# Patient Record
Sex: Male | Born: 2000 | Race: Black or African American | Hispanic: No | Marital: Single | Smoking: Current every day smoker
Health system: Southern US, Community
[De-identification: ages and names within clinical notes are randomized; demographics above are authoritative.]

## PROBLEM LIST (undated history)

## (undated) DIAGNOSIS — J45909 Unspecified asthma, uncomplicated: Secondary | ICD-10-CM

## (undated) DIAGNOSIS — L309 Dermatitis, unspecified: Secondary | ICD-10-CM

---
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
## 2019-05-01 ENCOUNTER — Emergency Department (HOSPITAL_COMMUNITY)
Admission: EM | Admit: 2019-05-01 | Discharge: 2019-05-01 | Disposition: A | Discharge status: Home / Self Care | Payer: Medicaid Other | Attending: Emergency Medicine | Admitting: Emergency Medicine

## 2019-05-01 ENCOUNTER — Encounter (HOSPITAL_COMMUNITY): Payer: Self-pay | Admitting: Emergency Medicine

## 2019-05-01 ENCOUNTER — Emergency Department (HOSPITAL_COMMUNITY): Payer: Medicaid Other

## 2019-05-01 DIAGNOSIS — Z113 Encounter for screening for infections with a predominantly sexual mode of transmission: Secondary | ICD-10-CM | POA: Insufficient documentation

## 2019-05-01 DIAGNOSIS — J45909 Unspecified asthma, uncomplicated: Secondary | ICD-10-CM | POA: Diagnosis not present

## 2019-05-01 DIAGNOSIS — N50812 Left testicular pain: Secondary | ICD-10-CM

## 2019-05-01 DIAGNOSIS — N50811 Right testicular pain: Secondary | ICD-10-CM | POA: Insufficient documentation

## 2019-05-01 DIAGNOSIS — F17200 Nicotine dependence, unspecified, uncomplicated: Secondary | ICD-10-CM | POA: Insufficient documentation

## 2019-05-01 LAB — URINALYSIS, ROUTINE W REFLEX MICROSCOPIC
Bilirubin Urine: NEGATIVE
Glucose, UA: NEGATIVE mg/dL
Hgb urine dipstick: NEGATIVE
Ketones, ur: NEGATIVE mg/dL
Leukocytes,Ua: NEGATIVE
Nitrite: NEGATIVE
Protein, ur: NEGATIVE mg/dL
Specific Gravity, Urine: 1.018 (ref 1.005–1.030)
pH: 8 (ref 5.0–8.0)

## 2019-05-01 MED ORDER — DOXYCYCLINE HYCLATE 100 MG PO CAPS: 100.0000 mg | ORAL_CAPSULE | Freq: Two times a day (BID) | ORAL | 0 refills | Status: AC

## 2019-05-01 MED ORDER — LIDOCAINE HCL (PF) 1 % IJ SOLN
INTRAMUSCULAR | Status: AC
  Filled 2019-05-01: qty 5

## 2019-05-01 MED ORDER — CEFTRIAXONE SODIUM 500 MG IJ SOLR
500.0000 mg | Freq: Once | INTRAMUSCULAR | Status: AC
  Administered 2019-05-01: 19:00:00 500 mg via INTRAMUSCULAR
  Filled 2019-05-01: qty 500

## 2019-05-01 MED ORDER — DOXYCYCLINE HYCLATE 100 MG PO TABS
100.0000 mg | ORAL_TABLET | Freq: Once | ORAL | Status: AC
  Administered 2019-05-01: 19:00:00 100 mg via ORAL
  Filled 2019-05-01: qty 1

## 2019-05-01 NOTE — ED Notes (Signed)
Pt. Stated he feels nauseous. Pt. Reassessed and repositioned. MD notified.

## 2019-05-01 NOTE — Discharge Instructions (Addendum)
As discussed, your ultrasound was negative for any acute abnormalities.  Your symptoms could be related to STD infection.  You have been treated for STDs here in the ED.  I am sending you home with a prescription for an antibiotic.  Take twice a day for 7 days.  If your gonorrhea and Chlamydia test are negative, please call urology to schedule appointment for further evaluation.  Your urine was negative for any infections.  Follow-up with PCP if symptoms do not improve within the next week.  Return to the ER for new or worsening symptoms.

## 2019-05-01 NOTE — ED Provider Notes (Signed)
[facility] EMERGENCY DEPARTMENT Provider Note   CSN: [id number] Arrival date & time: 05/01/19  1634     History Chief Complaint  Patient presents with  . Testicle Pain  . Dysuria    Wayne Roach is a 19 y.o. male with a past medical history significant for asthma and eczema who presents to the ED due to consistent bilateral testicular pain and dysuria for the past month.  Patient states he was last sexually active back in February.  He tested negative for STDs in February; however, tested positive in March for Chlamydia.  Patient admits to intermittent bilateral testicular pain with and without urination.  Denies painful defecations.  Denies penile discharge, testicular swelling, penile swelling, abdominal pain, nausea, vomiting, diarrhea, fever, chills. No alleviating factors.   History obtained from patient and past medical records. No interpreter used during encounter.    Past Medical History:  Diagnosis Date  . Asthma   . Eczema     There are no problems to display for this patient.   History reviewed. No pertinent surgical history.     Family History  Family history unknown: Yes    Social History   Tobacco Use  . Smoking status: Current Every Day Smoker  . Smokeless tobacco: Never Used  Substance Use Topics  . Alcohol use: Yes  . Drug use: Yes    Types: Marijuana    Home Medications Prior to Admission medications   Medication Sig Start Date End Date Taking? Authorizing Provider  doxycycline (VIBRAMYCIN) 100 MG capsule Take 1 capsule (100 mg total) by mouth 2 (two) times daily for 7 days. 05/01/19 05/08/19  Suzy Bouchard, PA-C    Allergies    Patient has no known allergies.  Review of Systems   Review of Systems  Constitutional: Negative for fever.  Gastrointestinal: Negative for abdominal pain, diarrhea, nausea and vomiting.  Genitourinary: Positive for dysuria and testicular pain. Negative for difficulty urinating, discharge,  hematuria, penile pain, penile swelling and scrotal swelling.  All other systems reviewed and are negative.   Physical Exam Updated Vital Signs BP (!) 140/93 (BP Location: Right Arm)   Pulse 77   Temp 98.1 F (36.7 C) (Oral)   Resp 16   SpO2 97%   Physical Exam Vitals and nursing note reviewed. Exam conducted with a chaperone present.  Constitutional:      General: He is not in acute distress.    Appearance: He is not ill-appearing.  HENT:     Head: Normocephalic.  Eyes:     Pupils: Pupils are equal, round, and reactive to light.  Cardiovascular:     Rate and Rhythm: Normal rate and regular rhythm.     Pulses: Normal pulses.     Heart sounds: Normal heart sounds. No murmur. No friction rub. No gallop.   Pulmonary:     Effort: Pulmonary effort is normal.     Breath sounds: Normal breath sounds.  Abdominal:     General: Abdomen is flat. There is no distension.     Palpations: Abdomen is soft.     Tenderness: There is no abdominal tenderness. There is no guarding or rebound.  Genitourinary:    Penis: Normal and circumcised. No discharge or lesions.      Testes: Normal.        Right: Mass, tenderness or swelling not present.        Left: Mass, tenderness or swelling not present.     Epididymis:  Right: Normal.     Left: Normal.     Comments: Normal circumcised penis without any lesions.  Testes without tenderness, swelling, or masses. Musculoskeletal:     Cervical back: Neck supple.     Comments: Able to move all 4 extremities without difficulty.  Skin:    General: Skin is warm and dry.  Neurological:     General: No focal deficit present.     Mental Status: He is alert.     ED Results / Procedures / Treatments   Labs (all labs ordered are listed, but only abnormal results are displayed) Labs Reviewed  URINALYSIS, ROUTINE W REFLEX MICROSCOPIC - Abnormal; Notable for the following components:      Result Value   APPearance CLOUDY (*)    All other components  within normal limits  GC/CHLAMYDIA PROBE AMP ([place]) NOT AT [facility]    EKG None  Radiology US SCROTUM W/DOPPLER  Result Date: 05/01/2019 CLINICAL DATA:  Testicular pain bilaterally for 2 months EXAM: SCROTAL ULTRASOUND DOPPLER ULTRASOUND OF THE TESTICLES TECHNIQUE: Complete ultrasound examination of the testicles, epididymis, and other scrotal structures was performed. Color and spectral Doppler ultrasound were also utilized to evaluate blood flow to the testicles. COMPARISON:  None. FINDINGS: Right testicle Measurements: 3.3 x 2.3 x 3.4 cm. No mass or microlithiasis visualized. Left testicle Measurements: 3.5 x 2.3 x 3.2 cm. No mass or microlithiasis visualized. Right epididymis:  Normal in size and appearance. Left epididymis:  Normal in size and appearance. Hydrocele:  None visualized. Varicocele:  None visualized. Pulsed Doppler interrogation of both testes demonstrates normal low resistance arterial and venous waveforms bilaterally. IMPRESSION: Unremarkable testicular ultrasound bilaterally. Electronically Signed   By: Alcide Clever M.D.   On: 05/01/2019 19:51    Procedures Procedures (including critical care time)  Medications Ordered in ED Medications  lidocaine (PF) (XYLOCAINE) 1 % injection (has no administration in time range)  cefTRIAXone (ROCEPHIN) injection 500 mg (500 mg Intramuscular Given 05/01/19 1843)  doxycycline (VIBRA-TABS) tablet 100 mg (100 mg Oral Given 05/01/19 1841)    ED Course  I have reviewed the triage vital signs and the nursing notes.  Pertinent labs & imaging results that were available during my care of the patient were reviewed by me and considered in my medical decision making (see chart for details).    MDM Rules/Calculators/A&P                     19 year old male presents to the ED due to bilateral testicular pain and dysuria x1 month.  Patient was recently treated for STDs in March when he tested positive for chlamydia.  Patient denies recent  sexual activity.  Patient is afebrile, not tachycardic or hypoxic.  Patient no acute distress and non-ill-appearing.  Normal circumcised penis without discharge or lesions.  No testicular tenderness, swelling, or masses.  Will obtain UA to rule out urinary tract infection.  We will also obtain gonorrhea/chlamydia test.  Ultrasound ordered to rule out epididymitis given consistent bilateral testicular pain.  UA negative for signs of infection and hematuria.  Gonorrhea/chlamydia test pending.  Patient treated prophylactically here in the ED with Rocephin and doxycycline per new CDC guidelines.  Will discharge patient with doxycyline twice daily x7 days.  Scrotal ultrasound personally reviewed which is negative for signs of epididymitis, masses, or torsion. No abdominal tenderness, abdominal pain or painful bowel movements to indicate prostatitis.  Suspect symptoms related to possible STD infection. Patient to be discharged with instructions to  follow up with PCP. Discussed importance of using protection when sexually active. Pt understands that they have GC/Chlamydia cultures pending and that they will need to inform all sexual partners if results return positive.  Patient given urology number at discharge and encouraged to call to schedule an appointment if his gonorrhea and Chlamydia test are negative in the next few days. Strict ED precautions discussed with patient. Patient states understanding and agrees to plan. Patient discharged home in no acute distress and stable vitals.   Final Clinical Impression(s) / ED Diagnoses Final diagnoses:  Pain in both testicles  Screening examination for STD (sexually transmitted disease)    Rx / DC Orders ED Discharge Orders         Ordered    doxycycline (VIBRAMYCIN) 100 MG capsule  2 times daily     05/01/19 1826           Jesusita Oka 05/01/19 2001    Arby Barrette, MD 05/07/19 [id number]

## 2019-05-01 NOTE — ED Triage Notes (Signed)
Pt reports 1 month of testicle pain bilateral and burning with urination. Pt was treated for STD 1 month ago but pain in testicles is still present.

## 2019-05-02 LAB — GC/CHLAMYDIA PROBE AMP (~~LOC~~) NOT AT ARMC
Chlamydia: NEGATIVE
Comment: NEGATIVE
Comment: NORMAL
Neisseria Gonorrhea: NEGATIVE

## 2020-11-08 IMAGING — US US SCROTUM W/ DOPPLER COMPLETE
1 series · 14 of 25 positions shown · non-contrast
Comparison: None.

CLINICAL DATA: Testicular pain bilaterally for 2 months

EXAM:
SCROTAL ULTRASOUND
DOPPLER ULTRASOUND OF THE TESTICLES
TECHNIQUE: Complete ultrasound examination of the testicles, epididymis, and
other scrotal structures was performed. Color and spectral Doppler
ultrasound were also utilized to evaluate blood flow to the
testicles.

[Series 1: us scrotum w/ doppler complete · 14 of 63 slices shown]
[im 1/63]
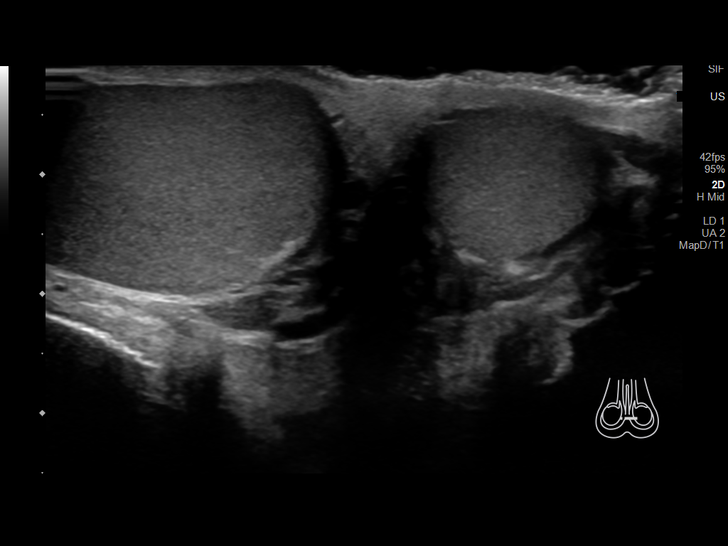
[im 6/63]
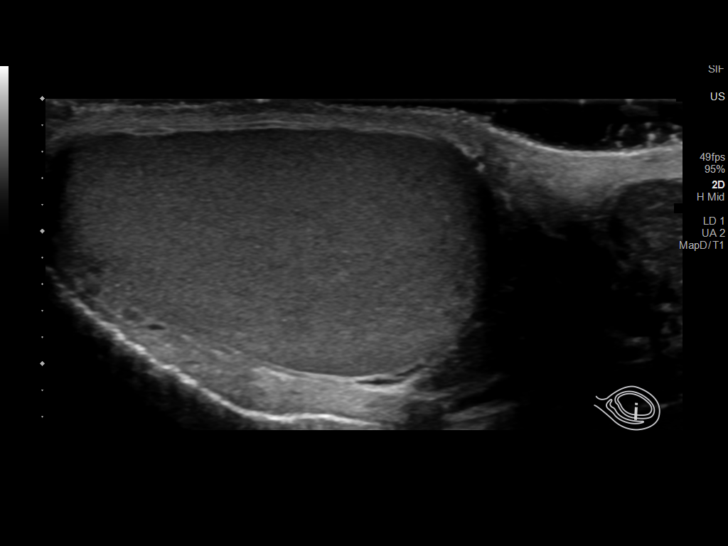
[im 11/63]
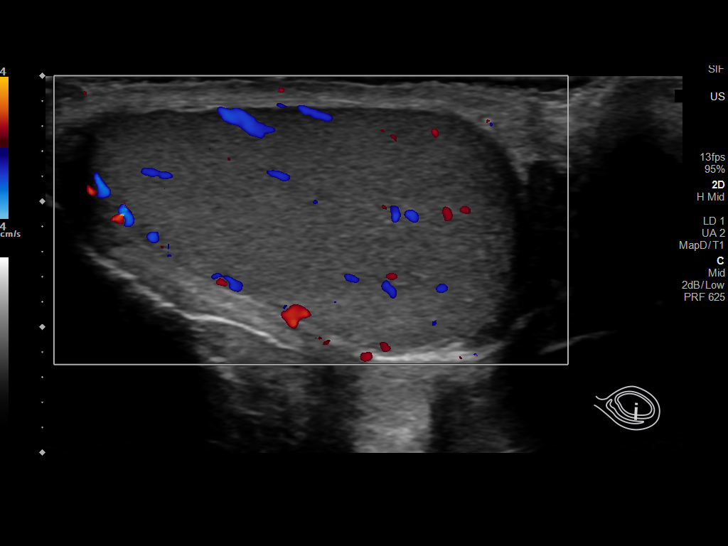
[im 16/63]
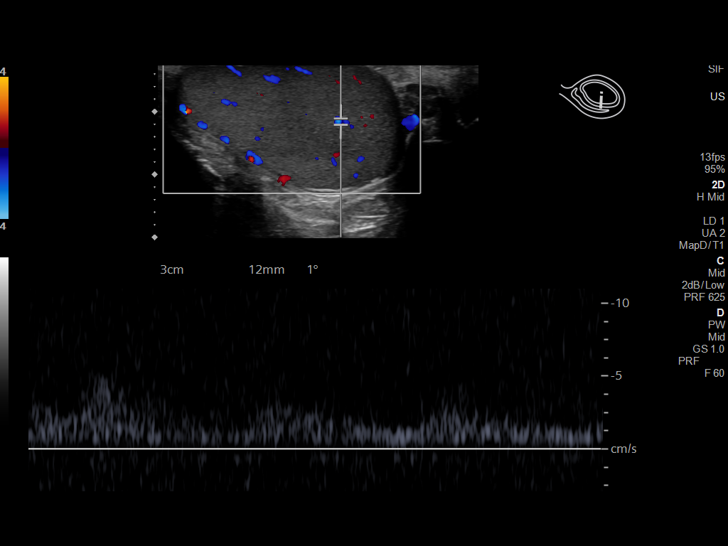
[im 21/63]
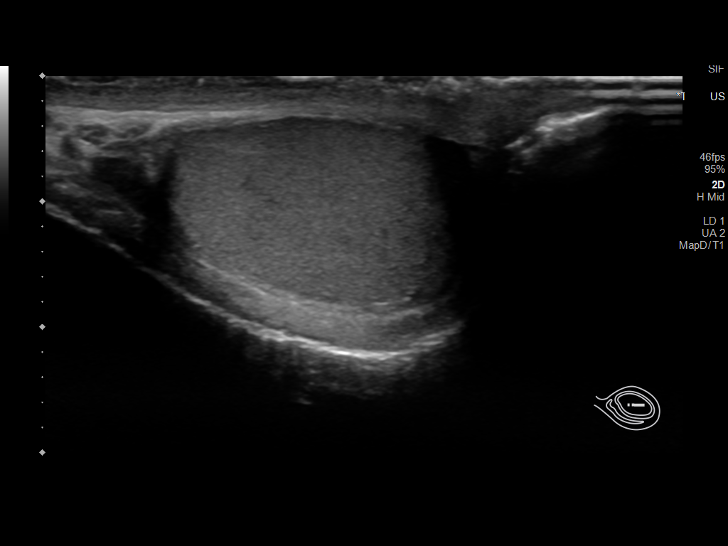
[im 24/63]
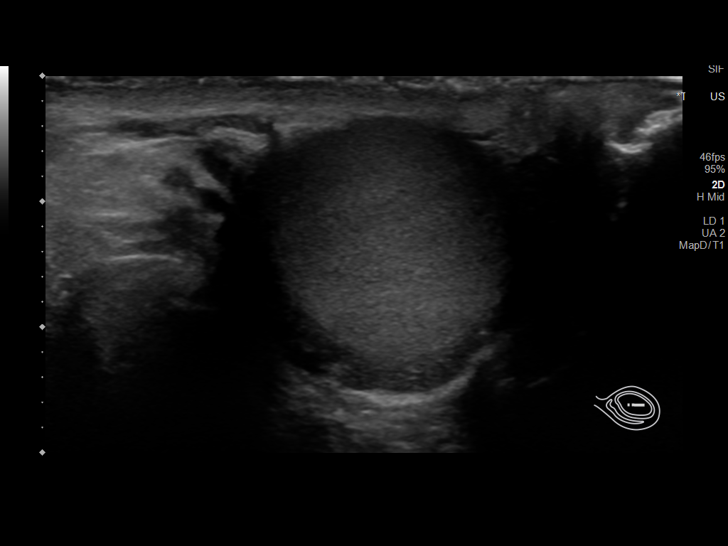
[im 29/63]
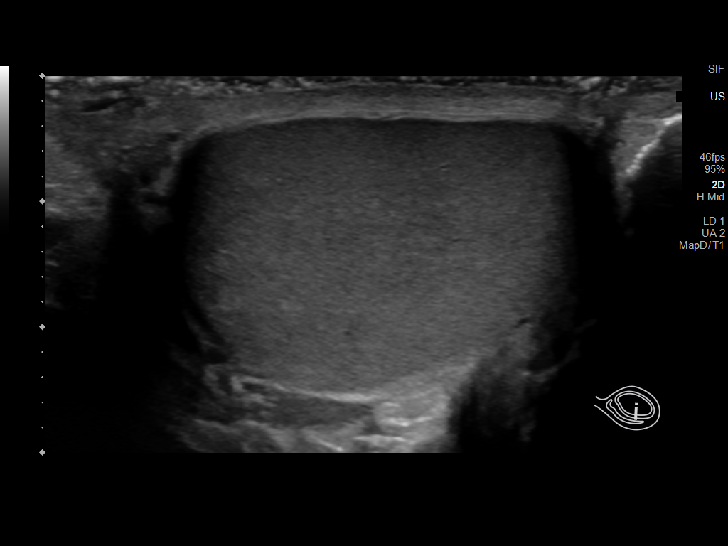
[im 34/63]
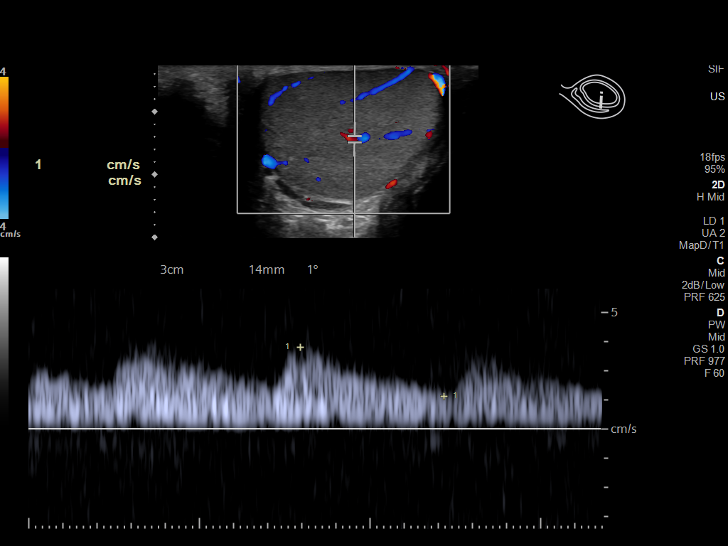
[im 39/63]
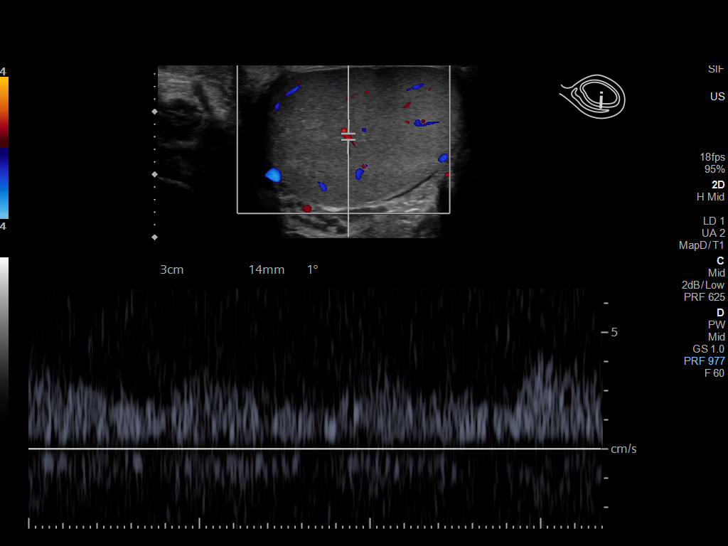
[im 42/63]
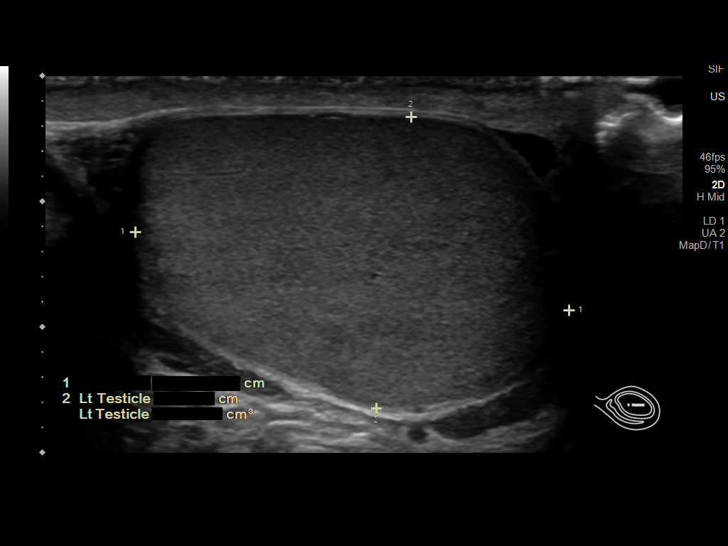
[im 47/63]
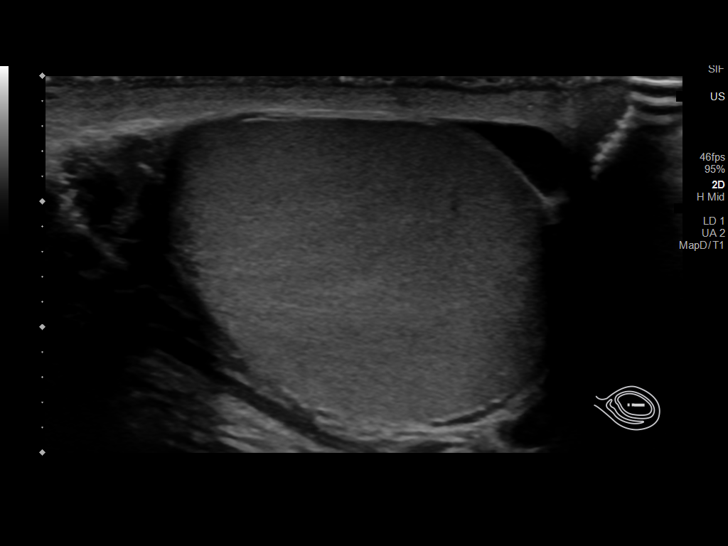
[im 52/63]
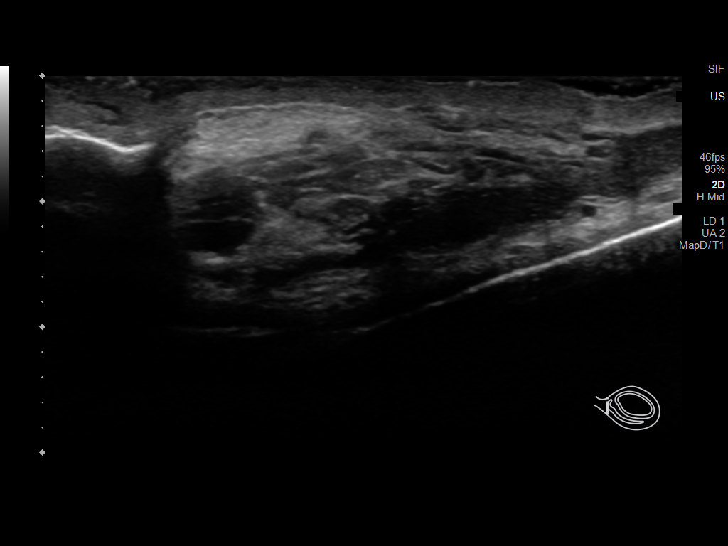
[im 57/63]
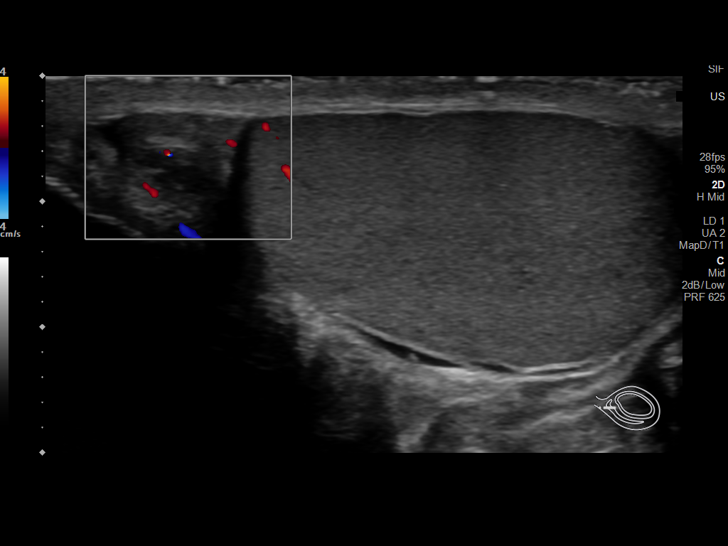
[im 63/63]
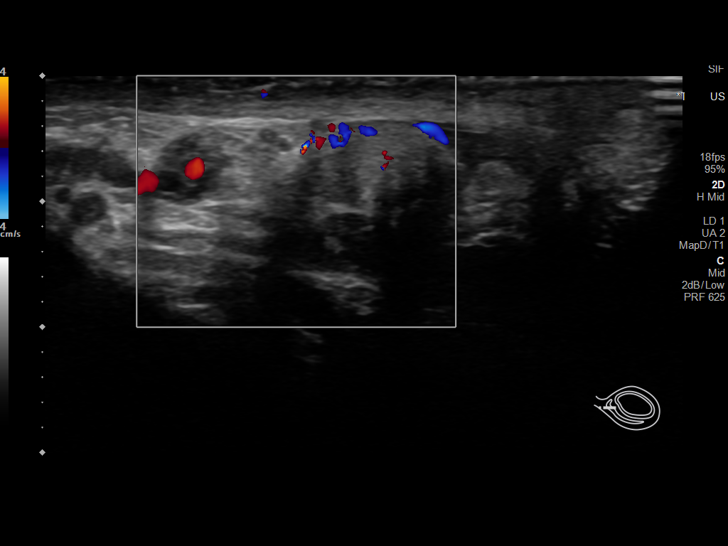

[14 of 25 positions shown; findings below may reference images not displayed]

FINDINGS: Right testicle

Measurements: 3.3 x 2.3 x 3.4 cm. No mass or microlithiasis
visualized.

Left testicle

Measurements: 3.5 x 2.3 x 3.2 cm. No mass or microlithiasis
visualized.

Right epididymis:  Normal in size and appearance.

Left epididymis:  Normal in size and appearance.

Hydrocele:  None visualized.

Varicocele:  None visualized.

Pulsed Doppler interrogation of both testes demonstrates normal low
resistance arterial and venous waveforms bilaterally.
IMPRESSION: Unremarkable testicular ultrasound bilaterally.

## 2021-09-23 ENCOUNTER — Ambulatory Visit
Admission: EM | Admit: 2021-09-23 | Discharge: 2021-09-23 | Disposition: A | Discharge status: Home / Self Care | Payer: Medicaid Other | Attending: Physician Assistant | Admitting: Physician Assistant

## 2021-09-23 DIAGNOSIS — B370 Candidal stomatitis: Secondary | ICD-10-CM | POA: Insufficient documentation

## 2021-09-23 MED ORDER — NYSTATIN 100000 UNIT/ML MT SUSP: 500000.0000 [IU] | Freq: Four times a day (QID) | OROMUCOSAL | 0 refills | Status: AC

## 2021-09-23 NOTE — ED Provider Notes (Signed)
[facility]    CSN: [id number] Arrival date & time: 09/23/21  1323      History   Chief Complaint Chief Complaint  Patient presents with   tongue lesions    HPI Wayne Roach is a 21 y.o. male.   Patient here today for evaluation of a coating to his tongue that is been present for several weeks.  He denies any recent antibiotic use and does not use steroid inhalers.  He reports some mild pain associated with symptoms.  He has not had any vomiting but has had some mild abdominal pain at times.  He reports initially he thought he did have skin rash but this is resolved.  The history is provided by the patient.    Past Medical History:  Diagnosis Date   Asthma    Eczema     There are no problems to display for this patient.   History reviewed. No pertinent surgical history.     Home Medications    Prior to Admission medications   Medication Sig Start Date End Date Taking? Authorizing Provider  nystatin (MYCOSTATIN) 100000 UNIT/ML suspension Take 5 mLs (500,000 Units total) by mouth 4 (four) times daily for 7 days. 09/23/21 09/30/21 Yes Tomi Bamberger, PA-C    Family History Family History  Family history unknown: Yes    Social History Social History   Tobacco Use   Smoking status: Every Day   Smokeless tobacco: Never  Substance Use Topics   Alcohol use: Yes   Drug use: Yes    Types: Marijuana     Allergies   Patient has no known allergies.   Review of Systems Review of Systems  Constitutional:  Negative for chills and fever.  HENT:  Positive for sore throat. Negative for congestion.   Eyes:  Negative for discharge and redness.  Respiratory:  Negative for shortness of breath.   Gastrointestinal:  Positive for abdominal pain. Negative for nausea and vomiting.  Neurological:  Negative for numbness.     Physical Exam Triage Vital Signs ED Triage Vitals  Enc Vitals Group     BP 09/23/21 1455 124/78     Pulse Rate 09/23/21 1455 85      Resp 09/23/21 1455 18     Temp 09/23/21 1455 98 F (36.7 C)     Temp Source 09/23/21 1455 Oral     SpO2 09/23/21 1455 98 %     Weight --      Height --      Head Circumference --      Peak Flow --      Pain Score 09/23/21 1456 8     Pain Loc --      Pain Edu? --      Excl. in GC? --    No data found.  Updated Vital Signs BP 124/78 (BP Location: Right Arm)   Pulse 85   Temp 98 F (36.7 C) (Oral)   Resp 18   SpO2 98%   Physical Exam Vitals and nursing note reviewed.  Constitutional:      General: He is not in acute distress.    Appearance: Normal appearance. He is not ill-appearing.  HENT:     Head: Normocephalic and atraumatic.     Nose: Nose normal. No congestion or rhinorrhea.     Mouth/Throat:     Mouth: Mucous membranes are moist.     Comments: White coating to tongue, oropharynx erythematous Eyes:     Conjunctiva/sclera: Conjunctivae normal.  Cardiovascular:     Rate and Rhythm: Normal rate.  Pulmonary:     Effort: Pulmonary effort is normal.  Neurological:     Mental Status: He is alert.  Psychiatric:        Mood and Affect: Mood normal.        Behavior: Behavior normal.        Thought Content: Thought content normal.      UC Treatments / Results  Labs (all labs ordered are listed, but only abnormal results are displayed) Labs Reviewed  CYTOLOGY, (ORAL, ANAL, URETHRAL) ANCILLARY ONLY    EKG   Radiology No results found.  Procedures Procedures (including critical care time)  Medications Ordered in UC Medications - No data to display  Initial Impression / Assessment and Plan / UC Course  I have reviewed the triage vital signs and the nursing notes.  Pertinent labs & imaging results that were available during my care of the patient were reviewed by me and considered in my medical decision making (see chart for details).    Questionable thrush versus gonorrhea, will order STD screening as well as nystatin to cover candida.  Recommended  follow-up if symptoms do not improve or recur and discussed that the rash can be an indicator of immunosuppression.  Patient expresses understanding.  Final Clinical Impressions(s) / UC Diagnoses   Final diagnoses:  Thrush   Discharge Instructions   None    ED Prescriptions     Medication Sig Dispense Auth. Provider   nystatin (MYCOSTATIN) 100000 UNIT/ML suspension Take 5 mLs (500,000 Units total) by mouth 4 (four) times daily for 7 days. 473 mL Tomi Bamberger, PA-C      PDMP not reviewed this encounter.   Tomi Bamberger, PA-C 09/23/21 1605

## 2021-09-23 NOTE — ED Triage Notes (Signed)
Pt c/o possible yeast infection on tongue and scab tissue in nose as well that has been happening for several weeks.

## 2021-09-24 LAB — CYTOLOGY, (ORAL, ANAL, URETHRAL) ANCILLARY ONLY
Chlamydia: NEGATIVE
Comment: NEGATIVE
Comment: NEGATIVE
Comment: NORMAL
Neisseria Gonorrhea: NEGATIVE
Trichomonas: NEGATIVE

## 2021-12-25 ENCOUNTER — Ambulatory Visit
Admission: EM | Admit: 2021-12-25 | Discharge: 2021-12-25 | Disposition: A | Discharge status: Home / Self Care | Payer: Medicaid Other | Attending: Physician Assistant | Admitting: Physician Assistant

## 2021-12-25 DIAGNOSIS — Z113 Encounter for screening for infections with a predominantly sexual mode of transmission: Secondary | ICD-10-CM | POA: Diagnosis present

## 2021-12-25 DIAGNOSIS — L989 Disorder of the skin and subcutaneous tissue, unspecified: Secondary | ICD-10-CM | POA: Diagnosis not present

## 2021-12-25 DIAGNOSIS — R109 Unspecified abdominal pain: Secondary | ICD-10-CM | POA: Diagnosis not present

## 2021-12-25 DIAGNOSIS — K1370 Unspecified lesions of oral mucosa: Secondary | ICD-10-CM | POA: Diagnosis not present

## 2021-12-25 MED ORDER — TRIAMCINOLONE ACETONIDE 0.1 % EX CREA: 1.0000 | TOPICAL_CREAM | Freq: Two times a day (BID) | CUTANEOUS | 0 refills | Status: AC

## 2021-12-25 MED ORDER — MUPIROCIN 2 % EX OINT: 1.0000 | TOPICAL_OINTMENT | Freq: Two times a day (BID) | CUTANEOUS | 0 refills | Status: AC

## 2021-12-25 NOTE — ED Triage Notes (Signed)
Pt presents with painful lesion on face with facial swelling for past few weeks; pt also complains of generalized abdominal pain with intermittent nausea & vomiting X 3 days.

## 2021-12-25 NOTE — ED Provider Notes (Signed)
[facility]    CSN: [id number] Arrival date & time: 12/25/21  1540      History   Chief Complaint Chief Complaint  Patient presents with   Skin Ulcer   Abdominal Pain    HPI Wayne Roach is a 21 y.o. male.   Patient here today for evaluation of painful lesion on his face with facial swelling that is been present for a few weeks.  He states that initially he had more erythema and pain to the area of lesion but has had some spreading.  He has not had any fever that he is aware of.  He does report a lesion to the lower lip mucosa that was initially erythematous but now looks white but is uncomfortable, specifically if he eats anything that is hot in temperature.  He notes he has also had some intermittent nausea with 1 episode of vomiting daily the last 3 days.  He has had some abdominal pain associated with same.  He denies any diarrhea and has not any blood in his stool.  He states he will feel as if he has to have a bowel movement but does not have improvement with bowel movements.  Abdominal pain is intermittent. He has used tea tree oil on face without improvement. Patient would also like STD screening in the event symptoms could be related to same.   The history is provided by the patient.  Abdominal Pain Associated symptoms: nausea and vomiting   Associated symptoms: no chills, no cough, no diarrhea, no fever and no shortness of breath     Past Medical History:  Diagnosis Date   Asthma    Eczema     There are no problems to display for this patient.   History reviewed. No pertinent surgical history.     Home Medications    Prior to Admission medications   Medication Sig Start Date End Date Taking? Authorizing Provider  mupirocin ointment (BACTROBAN) 2 % Apply 1 Application topically 2 (two) times daily. 12/25/21  Yes Tomi Bamberger, PA-C  triamcinolone cream (KENALOG) 0.1 % Apply 1 Application topically 2 (two) times daily. 12/25/21  Yes Tomi Bamberger, PA-C    Family History Family History  Family history unknown: Yes    Social History Social History   Tobacco Use   Smoking status: Every Day   Smokeless tobacco: Never  Substance Use Topics   Alcohol use: Yes   Drug use: Yes    Types: Marijuana     Allergies   Patient has no known allergies.   Review of Systems Review of Systems  Constitutional:  Negative for chills and fever.  HENT:  Positive for facial swelling and mouth sores.   Eyes:  Negative for discharge and redness.  Respiratory:  Negative for cough and shortness of breath.   Gastrointestinal:  Positive for abdominal pain, nausea and vomiting. Negative for blood in stool and diarrhea.  Skin:  Positive for color change and rash.     Physical Exam Triage Vital Signs ED Triage Vitals  Enc Vitals Group     BP 12/25/21 1608 (!) 143/87     Pulse Rate 12/25/21 1608 75     Resp 12/25/21 1608 18     Temp 12/25/21 1608 98.2 F (36.8 C)     Temp Source 12/25/21 1608 Oral     SpO2 12/25/21 1608 97 %     Weight --      Height --  Head Circumference --      Peak Flow --      Pain Score 12/25/21 1610 6     Pain Loc --      Pain Edu? --      Excl. in GC? --    No data found.  Updated Vital Signs BP (!) 143/87 (BP Location: Left Arm)   Pulse 75   Temp 98.2 F (36.8 C) (Oral)   Resp 18   SpO2 97%   Physical Exam Vitals and nursing note reviewed.  Constitutional:      General: He is not in acute distress.    Appearance: He is well-developed. He is not ill-appearing.  HENT:     Head: Normocephalic and atraumatic.     Nose: Nose normal. No congestion or rhinorrhea.     Mouth/Throat:     Mouth: Mucous membranes are moist.     Comments: Approx 2 cm confluent white appearing lesion to inner lower lip mucosa Eyes:     Conjunctiva/sclera: Conjunctivae normal.  Cardiovascular:     Rate and Rhythm: Normal rate and regular rhythm.     Heart sounds: Normal heart sounds.  Pulmonary:     Effort:  Pulmonary effort is normal. No respiratory distress.     Breath sounds: Normal breath sounds. No wheezing, rhonchi or rales.  Abdominal:     General: Abdomen is flat. Bowel sounds are normal. There is no distension.     Palpations: Abdomen is soft.     Tenderness: There is no abdominal tenderness. There is no guarding or rebound.  Skin:    Comments: Single annular lesion noted to left lower mandibular area, approx 3 cm in diameter- flaking raised border, papular lesions noted to right maxillary area  Neurological:     Mental Status: He is alert.      UC Treatments / Results  Labs (all labs ordered are listed, but only abnormal results are displayed) Labs Reviewed  HIV ANTIBODY (ROUTINE TESTING W REFLEX)  HEPATITIS PANEL, ACUTE  RPR  CBC WITH DIFFERENTIAL/PLATELET  COMPREHENSIVE METABOLIC PANEL  ANA W/REFLEX IF POSITIVE  CYTOLOGY, (ORAL, ANAL, URETHRAL) ANCILLARY ONLY    EKG   Radiology No results found.  Procedures Procedures (including critical care time)  Medications Ordered in UC Medications - No data to display  Initial Impression / Assessment and Plan / UC Course  I have reviewed the triage vital signs and the nursing notes.  Pertinent labs & imaging results that were available during my care of the patient were reviewed by me and considered in my medical decision making (see chart for details).   Unknown etiology of symptoms- differential includes discoid lupus, lichen planus, secondary impetigo, tinea, etc. Will order labs for further recommendation including STD screening. Steroid cream and mupirocin prescribed. Recommended bland diet and increased fluids for hopeful improvement of abdominal pain that may or may not be related to skin changes.  Discussed possibility of viral etiology. Recommend evaluation in the ED with any worsening abdominal symptoms as he may need imaging and stat labs. Patient expresses understanding. Encouraged follow up with any further  concerns.    Final Clinical Impressions(s) / UC Diagnoses   Final diagnoses:  Oral lesion  Skin lesion of cheek  Screening for STD (sexually transmitted disease)  Abdominal cramping     Discharge Instructions       Await results of testing for further recommendation.   Please follow up with dermatology if no improvement with treatment.  ED Prescriptions     Medication Sig Dispense Auth. Provider   triamcinolone cream (KENALOG) 0.1 % Apply 1 Application topically 2 (two) times daily. 30 g Tomi Bamberger, PA-C   mupirocin ointment (BACTROBAN) 2 % Apply 1 Application topically 2 (two) times daily. 22 g Tomi Bamberger, PA-C      PDMP not reviewed this encounter.   Tomi Bamberger, PA-C 12/25/21 [phone]

## 2021-12-25 NOTE — Discharge Instructions (Signed)
  Await results of testing for further recommendation.   Please follow up with dermatology if no improvement with treatment.

## 2021-12-28 LAB — CBC WITH DIFFERENTIAL/PLATELET
Basophils Absolute: 0 10*3/uL (ref 0.0–0.2)
Basos: 1 %
EOS (ABSOLUTE): 0.1 10*3/uL (ref 0.0–0.4)
Eos: 1 %
Hematocrit: 47.8 % (ref 37.5–51.0)
Hemoglobin: 16.3 g/dL (ref 13.0–17.7)
Immature Grans (Abs): 0 10*3/uL (ref 0.0–0.1)
Immature Granulocytes: 0 %
Lymphocytes Absolute: 2.3 10*3/uL (ref 0.7–3.1)
Lymphs: 32 %
MCH: 30.4 pg (ref 26.6–33.0)
MCHC: 34.1 g/dL (ref 31.5–35.7)
MCV: 89 fL (ref 79–97)
Monocytes Absolute: 0.7 10*3/uL (ref 0.1–0.9)
Monocytes: 9 %
Neutrophils Absolute: 4 10*3/uL (ref 1.4–7.0)
Neutrophils: 57 %
Platelets: 272 10*3/uL (ref 150–450)
RBC: 5.37 x10E6/uL (ref 4.14–5.80)
RDW: 12.4 % (ref 11.6–15.4)
WBC: 7.1 10*3/uL (ref 3.4–10.8)

## 2021-12-28 LAB — COMPREHENSIVE METABOLIC PANEL
ALT: 14 IU/L (ref 0–44)
AST: 18 IU/L (ref 0–40)
Albumin/Globulin Ratio: 1.1 — ABNORMAL LOW (ref 1.2–2.2)
Albumin: 4.4 g/dL (ref 4.3–5.2)
Alkaline Phosphatase: 122 IU/L — ABNORMAL HIGH (ref 44–121)
BUN/Creatinine Ratio: 8 — ABNORMAL LOW (ref 9–20)
BUN: 7 mg/dL (ref 6–20)
Bilirubin Total: 0.5 mg/dL (ref 0.0–1.2)
CO2: 22 mmol/L (ref 20–29)
Calcium: 9.6 mg/dL (ref 8.7–10.2)
Chloride: 103 mmol/L (ref 96–106)
Creatinine, Ser: 0.87 mg/dL (ref 0.76–1.27)
Globulin, Total: 3.9 g/dL (ref 1.5–4.5)
Glucose: 81 mg/dL (ref 70–99)
Potassium: 4 mmol/L (ref 3.5–5.2)
Sodium: 142 mmol/L (ref 134–144)
Total Protein: 8.3 g/dL (ref 6.0–8.5)
eGFR: 125 mL/min/{1.73_m2} (ref >59)

## 2021-12-28 LAB — CYTOLOGY, (ORAL, ANAL, URETHRAL) ANCILLARY ONLY
Chlamydia: POSITIVE — AB
Comment: NEGATIVE
Comment: NEGATIVE
Comment: NORMAL
Neisseria Gonorrhea: POSITIVE — AB
Trichomonas: NEGATIVE

## 2021-12-28 LAB — RPR, QUANT+TP ABS (REFLEX)
Rapid Plasma Reagin, Quant: 1:64 {titer} — ABNORMAL HIGH
T Pallidum Abs: REACTIVE — AB

## 2021-12-28 LAB — ANA W/REFLEX IF POSITIVE: Anti Nuclear Antibody (ANA): NEGATIVE

## 2021-12-28 LAB — HIV ANTIBODY (ROUTINE TESTING W REFLEX): HIV Screen 4th Generation wRfx: NONREACTIVE

## 2021-12-28 LAB — RPR: RPR Ser Ql: REACTIVE — AB

## 2021-12-29 ENCOUNTER — Telehealth (HOSPITAL_COMMUNITY): Payer: Self-pay | Admitting: Emergency Medicine

## 2021-12-29 MED ORDER — DOXYCYCLINE HYCLATE 100 MG PO CAPS: 100.0000 mg | ORAL_CAPSULE | Freq: Two times a day (BID) | ORAL | 0 refills | Status: AC

## 2021-12-29 NOTE — Telephone Encounter (Signed)
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  Will also need treatment with IM ROcephin 500mg  for positive Gonorrhea.  And finally, will need treatmen with Doxycycline.   Attempted to reach patient x 1, VM is not setup Prescription sent to pharmacy on file [facility] notified

## 2021-12-30 ENCOUNTER — Ambulatory Visit
Admission: EM | Admit: 2021-12-30 | Discharge: 2021-12-30 | Disposition: A | Discharge status: Home / Self Care | Payer: Medicaid Other | Attending: Physician Assistant | Admitting: Physician Assistant

## 2021-12-30 DIAGNOSIS — A539 Syphilis, unspecified: Secondary | ICD-10-CM | POA: Diagnosis not present

## 2021-12-30 DIAGNOSIS — A549 Gonococcal infection, unspecified: Secondary | ICD-10-CM | POA: Diagnosis not present

## 2021-12-30 MED ORDER — CEFTRIAXONE SODIUM 1 G IJ SOLR
1.0000 g | Freq: Once | INTRAMUSCULAR | Status: AC
  Administered 2021-12-30: 1 g via INTRAMUSCULAR

## 2021-12-30 MED ORDER — PENICILLIN G BENZATHINE 1200000 UNIT/2ML IM SUSY
2.4000 10*6.[IU] | PREFILLED_SYRINGE | Freq: Once | INTRAMUSCULAR | Status: AC
  Administered 2021-12-30: 2.4 10*6.[IU] via INTRAMUSCULAR

## 2021-12-30 NOTE — ED Triage Notes (Signed)
Pt presents for treatment for STDs resulted from cytology swab & blood work from last visit poer provider of rocephin and bicillin.

## 2022-05-14 ENCOUNTER — Ambulatory Visit (LOCAL_COMMUNITY_HEALTH_CENTER): Payer: Self-pay

## 2022-05-14 ENCOUNTER — Other Ambulatory Visit: Payer: Self-pay

## 2022-05-14 DIAGNOSIS — Z111 Encounter for screening for respiratory tuberculosis: Secondary | ICD-10-CM

## 2022-05-17 ENCOUNTER — Ambulatory Visit (LOCAL_COMMUNITY_HEALTH_CENTER): Payer: Self-pay

## 2022-05-17 DIAGNOSIS — Z111 Encounter for screening for respiratory tuberculosis: Secondary | ICD-10-CM

## 2022-05-17 LAB — TB SKIN TEST
Induration: 0 mm
TB Skin Test: NEGATIVE

## 2022-08-12 ENCOUNTER — Ambulatory Visit: Payer: Medicaid Other

## 2023-02-24 ENCOUNTER — Telehealth: Payer: Medicaid Other | Admitting: Physician Assistant

## 2023-02-24 ENCOUNTER — Ambulatory Visit: Payer: Medicaid Other

## 2023-02-24 DIAGNOSIS — R21 Rash and other nonspecific skin eruption: Secondary | ICD-10-CM

## 2023-02-24 DIAGNOSIS — Z202 Contact with and (suspected) exposure to infections with a predominantly sexual mode of transmission: Secondary | ICD-10-CM

## 2023-02-24 NOTE — Progress Notes (Signed)
  Because of concern of possible STI and need for exam and testing, I feel your condition warrants further evaluation and I recommend that you be seen in a face-to-face visit.   NOTE: There will be NO CHARGE for this E-Visit   If you are having a true medical emergency, please call 911.     For an urgent face to face visit, [facility] has multiple urgent care centers for your convenience.  Click the link below for the full list of locations and hours, walk-in wait times, appointment scheduling options and driving directions:  Urgent Care - [place], [place], [place], [place], [place], [place]  [place]     Your MyChart E-visit questionnaire answers were reviewed by a board certified advanced clinical practitioner to complete your personal care plan based on your specific symptoms.    Thank you for using e-Visits.

## 2023-03-02 ENCOUNTER — Ambulatory Visit: Payer: Medicaid Other
